# Patient Record
Sex: Female | Born: 1970 | Race: White | Hispanic: No | Marital: Married | Smoking: Never smoker
Health system: Southern US, Community
[De-identification: ages and names within clinical notes are randomized; demographics above are authoritative.]

## PROBLEM LIST (undated history)

## (undated) DIAGNOSIS — D849 Immunodeficiency, unspecified: Secondary | ICD-10-CM

## (undated) DIAGNOSIS — I10 Essential (primary) hypertension: Secondary | ICD-10-CM

---

## 2007-07-26 ENCOUNTER — Encounter: Admission: RE | Admit: 2007-07-26 | Discharge: 2007-07-26 | Payer: Self-pay | Admitting: Gastroenterology

## 2007-12-06 ENCOUNTER — Ambulatory Visit (HOSPITAL_COMMUNITY): Admission: RE | Admit: 2007-12-06 | Discharge: 2007-12-06 | Payer: Self-pay | Admitting: Obstetrics and Gynecology

## 2008-03-04 ENCOUNTER — Encounter: Admission: RE | Admit: 2008-03-04 | Discharge: 2008-03-04 | Payer: Self-pay | Admitting: Obstetrics and Gynecology

## 2009-06-11 ENCOUNTER — Encounter: Admission: RE | Admit: 2009-06-11 | Discharge: 2009-06-11 | Payer: Self-pay | Admitting: Orthopedic Surgery

## 2009-06-23 ENCOUNTER — Emergency Department (HOSPITAL_COMMUNITY): Admission: EM | Admit: 2009-06-23 | Discharge: 2009-06-23 | Payer: Self-pay | Admitting: Emergency Medicine

## 2009-11-17 ENCOUNTER — Ambulatory Visit (HOSPITAL_COMMUNITY): Admission: RE | Admit: 2009-11-17 | Discharge: 2009-11-17 | Payer: Self-pay | Admitting: Gastroenterology

## 2009-12-15 ENCOUNTER — Encounter: Admission: RE | Admit: 2009-12-15 | Discharge: 2009-12-15 | Payer: Self-pay | Admitting: Internal Medicine

## 2010-01-29 ENCOUNTER — Encounter: Admission: RE | Admit: 2010-01-29 | Discharge: 2010-01-29 | Payer: Self-pay | Admitting: Obstetrics and Gynecology

## 2010-07-19 LAB — CBC
HCT: 43.8 % (ref 36.0–46.0)
Hemoglobin: 15.4 g/dL — ABNORMAL HIGH (ref 12.0–15.0)
MCV: 90.2 fL (ref 78.0–100.0)
Platelets: 288 10*3/uL (ref 150–400)
WBC: 5.6 10*3/uL (ref 4.0–10.5)

## 2010-07-19 LAB — DIFFERENTIAL
Basophils Absolute: 0 10*3/uL (ref 0.0–0.1)
Basophils Relative: 1 % (ref 0–1)
Lymphocytes Relative: 33 % (ref 12–46)
Neutro Abs: 3.3 10*3/uL (ref 1.7–7.7)
Neutrophils Relative %: 59 % (ref 43–77)

## 2010-07-19 LAB — POCT I-STAT, CHEM 8
Chloride: 107 mEq/L (ref 96–112)
Creatinine, Ser: 0.6 mg/dL (ref 0.4–1.2)
Hemoglobin: 15.6 g/dL — ABNORMAL HIGH (ref 12.0–15.0)
Potassium: 4 mEq/L (ref 3.5–5.1)
Sodium: 140 mEq/L (ref 135–145)

## 2010-07-19 LAB — URINALYSIS, ROUTINE W REFLEX MICROSCOPIC
Glucose, UA: NEGATIVE mg/dL
Ketones, ur: NEGATIVE mg/dL
Nitrite: NEGATIVE
Protein, ur: NEGATIVE mg/dL
Urobilinogen, UA: 0.2 mg/dL (ref 0.0–1.0)

## 2010-07-19 LAB — PROTIME-INR
INR: 0.96 (ref 0.00–1.49)
Prothrombin Time: 12.7 seconds (ref 11.6–15.2)

## 2010-07-19 LAB — COMPREHENSIVE METABOLIC PANEL
Alkaline Phosphatase: 75 U/L (ref 39–117)
BUN: 8 mg/dL (ref 6–23)
Chloride: 103 mEq/L (ref 96–112)
Creatinine, Ser: 0.81 mg/dL (ref 0.4–1.2)
GFR calc non Af Amer: >60 mL/min (ref >60)
Glucose, Bld: 91 mg/dL (ref 70–99)
Potassium: 4 mEq/L (ref 3.5–5.1)
Total Bilirubin: 1.1 mg/dL (ref 0.3–1.2)

## 2010-07-19 LAB — POCT CARDIAC MARKERS
CKMB, poc: <1 ng/mL — ABNORMAL LOW (ref 1.0–8.0)
Myoglobin, poc: 51.9 ng/mL (ref 12–200)
Troponin i, poc: <0.05 ng/mL (ref 0.00–0.09)

## 2010-07-19 LAB — RAPID URINE DRUG SCREEN, HOSP PERFORMED: Benzodiazepines: NOT DETECTED

## 2010-07-19 LAB — ETHANOL: Alcohol, Ethyl (B): <5 mg/dL (ref 0–10)

## 2010-07-20 ENCOUNTER — Other Ambulatory Visit: Payer: Self-pay | Admitting: Otolaryngology

## 2010-07-20 DIAGNOSIS — R221 Localized swelling, mass and lump, neck: Secondary | ICD-10-CM

## 2010-07-27 ENCOUNTER — Ambulatory Visit
Admission: RE | Admit: 2010-07-27 | Discharge: 2010-07-27 | Disposition: A | Discharge status: Home / Self Care | Payer: BC Managed Care – PPO | Source: Ambulatory Visit | Attending: Otolaryngology | Admitting: Otolaryngology

## 2010-07-27 DIAGNOSIS — R221 Localized swelling, mass and lump, neck: Secondary | ICD-10-CM

## 2010-07-27 MED ORDER — IOHEXOL 300 MG/ML  SOLN
75.0000 mL | Freq: Once | INTRAMUSCULAR | Status: AC | PRN
  Administered 2010-07-27: 75 mL via INTRAVENOUS

## 2010-09-07 NOTE — Op Note (Signed)
NAME:  Eileen Jones, Eileen Jones NO.:  [id number]   MEDICAL RECORD NO.:  [id number]          PATIENT TYPE:  AMB   LOCATION:  SDC                           FACILITY:  [facility]   PHYSICIAN:  Malva Limes, M.D.    DATE OF BIRTH:  [birth year]   DATE OF PROCEDURE:  12/06/2007  DATE OF DISCHARGE:                               OPERATIVE REPORT   PREOPERATIVE DIAGNOSIS:  Patient desires permanent sterilization.   POSTOPERATIVE DIAGNOSIS:  Patient desires permanent sterilization.   PROCEDURES:  1. Removal of intrauterine device.  2. Bilateral tubal ligation, placement of Filshie clips.   SURGEON:  Malva Limes, MD   ANESTHESIA:  General endotracheal.   ANTIBIOTIC:  Ancef 1 g.   DRAINS:  None.   ESTIMATED BLOOD LOSS:  Minimal.   COMPLICATIONS:  None.   SPECIMENS:  None.   FINDINGS:  The patient had adhesions involving her ascending colon and  the right lower quadrant.  Fallopian tubes and ovaries were normal  bilaterally.  The uterus appeared normal.  There was no evidence of any  pelvic adhesions or endometriosis.   PROCEDURE:  The patient was taken to the operating room where she was  placed in dorsal supine position and general anesthetic was administered  without difficulty.  She was then placed in a dorsal lithotomy position.  She was prepped and draped in the usual fashion for this procedure.  A  sterile speculum was placed in the vagina.  The IUD was removed and  appeared to be a ParaGard IUD.  At that point, the Hulka tenaculum was  applied to the anterior cervical lip.  Next, the umbilicus was injected  with 0.25% Marcaine.  A vertical skin incision was made.  This was  carried down to the fascia.  The fascia was grasped with Kochers,  entered sharply.  Parietal peritoneum was grasped, entered sharply.  A 0  Vicryl suture was placed in a pursestring fashion.  The Hasson cannula  was placed into the abdominal cavity.  A 3 L of carbon dioxide was  insufflated.  The  patient was then placed in Trendelenburg.  On  examination, the patient had a normal-appearing liver and gallbladder.  The pelvis was as described above.  At this point, a Filshie clip was  placed in the isthmic portion of the left fallopian tube.  The clip was  placed perpendicular to the tube.  The entire tube appeared to be within  the clasp.  After the clasp was placed, it was noted that the round  ligament was also within the clasp.  The clasp appeared to be tightly  closed.  There was no evidence of any bleeding.  It was felt that an  attempt to try and remove the clip from the round ligament would create  excessive bleeding and possible complications.  It was felt that the  clip was tightly applied; and therefore, adequate sterilization would be  obtained.  Clip was placed on the right fallopian tube.  Next, the  entire tube appeared to be tightly closed with in the clasp.  At this  point, the  adhesions involving the ascending colon and the right lower  quadrant were taken down with sharp dissection.  The patient had been  complaining of right lower quadrant pain.  This concluded the procedure.  The instruments removed.  Pneumoperitoneum was released.  Fascia was  closed with 0 Vicryl suture in a pursestring fashion.  The skin was  closed with 3-0 Rapide in an interrupted fashion.  The patient was  extubated and taken to the recovery room in stable condition.  Instrument and lap counts correct x2.           ______________________________  Malva Limes, M.D.     MA/MEDQ  D:  12/06/2007  T:  12/07/2007  Job:  [id number]

## 2011-01-03 ENCOUNTER — Other Ambulatory Visit (HOSPITAL_COMMUNITY): Payer: Self-pay | Admitting: Gastroenterology

## 2011-01-03 DIAGNOSIS — K117 Disturbances of salivary secretion: Secondary | ICD-10-CM

## 2011-01-18 ENCOUNTER — Ambulatory Visit (HOSPITAL_COMMUNITY)
Admission: RE | Admit: 2011-01-18 | Discharge: 2011-01-18 | Disposition: A | Discharge status: Home / Self Care | Payer: BC Managed Care – PPO | Source: Ambulatory Visit | Attending: Gastroenterology | Admitting: Gastroenterology

## 2011-01-18 ENCOUNTER — Encounter (HOSPITAL_COMMUNITY)
Admission: RE | Admit: 2011-01-18 | Discharge: 2011-01-18 | Disposition: A | Discharge status: Home / Self Care | Payer: No Typology Code available for payment source | Source: Ambulatory Visit | Attending: Gastroenterology | Admitting: Gastroenterology

## 2011-01-18 DIAGNOSIS — K117 Disturbances of salivary secretion: Secondary | ICD-10-CM | POA: Insufficient documentation

## 2011-01-18 DIAGNOSIS — R948 Abnormal results of function studies of other organs and systems: Secondary | ICD-10-CM | POA: Insufficient documentation

## 2011-01-18 DIAGNOSIS — R634 Abnormal weight loss: Secondary | ICD-10-CM | POA: Insufficient documentation

## 2011-01-18 MED ORDER — TECHNETIUM TC 99M MEDRONATE IV KIT
25.0000 | PACK | Freq: Once | INTRAVENOUS | Status: AC | PRN
  Administered 2011-01-18: 25 via INTRAVENOUS

## 2011-01-21 LAB — CBC
MCHC: 33.9
RBC: 4.65
RDW: 13

## 2011-01-21 LAB — PREGNANCY, URINE: Preg Test, Ur: NEGATIVE

## 2012-01-04 ENCOUNTER — Other Ambulatory Visit: Payer: Self-pay | Admitting: Obstetrics and Gynecology

## 2012-01-04 DIAGNOSIS — R928 Other abnormal and inconclusive findings on diagnostic imaging of breast: Secondary | ICD-10-CM

## 2012-01-05 ENCOUNTER — Ambulatory Visit
Admission: RE | Admit: 2012-01-05 | Discharge: 2012-01-05 | Disposition: A | Discharge status: Home / Self Care | Payer: No Typology Code available for payment source | Source: Ambulatory Visit | Attending: Obstetrics and Gynecology | Admitting: Obstetrics and Gynecology

## 2012-01-05 DIAGNOSIS — R928 Other abnormal and inconclusive findings on diagnostic imaging of breast: Secondary | ICD-10-CM

## 2012-04-21 ENCOUNTER — Ambulatory Visit (INDEPENDENT_AMBULATORY_CARE_PROVIDER_SITE_OTHER): Payer: BC Managed Care – PPO | Admitting: Emergency Medicine

## 2012-04-21 VITALS — BP 129/85 | HR 77 | Temp 98.2°F | Resp 16 | Ht 68.0 in | Wt 152.0 lb

## 2012-04-21 DIAGNOSIS — N3000 Acute cystitis without hematuria: Secondary | ICD-10-CM

## 2012-04-21 DIAGNOSIS — R300 Dysuria: Secondary | ICD-10-CM

## 2012-04-21 LAB — POCT UA - MICROSCOPIC ONLY
Casts, Ur, LPF, POC: NEGATIVE
Crystals, Ur, HPF, POC: NEGATIVE
Yeast, UA: NEGATIVE

## 2012-04-21 LAB — POCT URINALYSIS DIPSTICK
Bilirubin, UA: NEGATIVE
Blood, UA: NEGATIVE
Glucose, UA: NEGATIVE
Ketones, UA: NEGATIVE
Leukocytes, UA: NEGATIVE
Nitrite, UA: NEGATIVE
Protein, UA: NEGATIVE
Spec Grav, UA: 1.01
Urobilinogen, UA: 0.2
pH, UA: 6

## 2012-04-21 MED ORDER — CIPROFLOXACIN HCL 500 MG PO TABS: 500.0000 mg | ORAL_TABLET | Freq: Two times a day (BID) | ORAL | Status: DC

## 2012-04-21 MED ORDER — PHENAZOPYRIDINE HCL 200 MG PO TABS: 200.0000 mg | ORAL_TABLET | Freq: Three times a day (TID) | ORAL | Status: DC | PRN

## 2012-04-21 NOTE — Addendum Note (Signed)
Addended by: Carmelina Dane on: 04/21/2012 07:09 PM   Modules accepted: Orders

## 2012-04-21 NOTE — Progress Notes (Signed)
[facility] [address] [place] [address] [id number] [phone]  Date:  04/21/2012   Name:  Eileen Jones   DOB:  20-Feb-1971   MRN:  [id number]  PCP:  No primary provider on file.    Chief Complaint: Dysuria   History of Present Illness:  Eileen Jones is a 41 y.o. very pleasant female patient who presents with the following:  Dysuria, urgency and frequency with malaise.  Started a week ago and not improving with hydration.  Chills but no fever.  No vaginal discharge or bleeding.  No history of antibiotic use.  Yesterday became worse.  Not sexually active so no dyspareunia.    There is no problem list on file for this patient.   No past medical history on file.  No past surgical history on file.  History  Substance Use Topics  . Smoking status: Never Smoker   . Smokeless tobacco: Not on file  . Alcohol Use: Not on file    No family history on file.  No Known Allergies  Medication list has been reviewed and updated.  Current Outpatient Prescriptions on File Prior to Visit  Medication Sig Dispense Refill  . montelukast (SINGULAIR) 10 MG tablet Take 10 mg by mouth at bedtime.      . nebivolol (BYSTOLIC) 5 MG tablet Take 5 mg by mouth daily.        Review of Systems:  As per HPI, otherwise negative.    Physical Examination: Filed Vitals:   04/21/12 1750  BP: 129/85  Pulse: 77  Temp: 98.2 F (36.8 C)  Resp: 16   Filed Vitals:   04/21/12 1750  Height: 5\' 8"  (1.727 m)  Weight: 152 lb (68.947 kg)   Body mass index is 23.11 kg/(m^2). Ideal Body Weight: Weight in (lb) to have BMI = 25: 164.1   GEN: WDWN, NAD, Non-toxic, A & O x 3 HEENT: Atraumatic, Normocephalic. Neck supple. No masses, No LAD. Ears and Nose: No external deformity. CV: RRR, No M/G/R. No JVD. No thrill. No extra heart sounds. PULM: CTA B, no wheezes, crackles, rhonchi. No retractions. No resp. distress. No accessory muscle use. ABD: S, NT, ND, +BS. No rebound. No  HSM. EXTR: No c/c/e NEURO Normal gait.  PSYCH: Normally interactive. Conversant. Not depressed or anxious appearing.  Calm demeanor.    Assessment and Plan: Acute cystitis cipro Pyridium Urine culture  Carmelina Dane, MD  Results for orders placed in visit on 04/21/12  POCT URINALYSIS DIPSTICK      Component Value Range   Color, UA yellow     Clarity, UA hazy     Glucose, UA neg     Bilirubin, UA neg     Ketones, UA neg     Spec Grav, UA 1.010     Blood, UA neg     pH, UA 6.0     Protein, UA neg     Urobilinogen, UA 0.2     Nitrite, UA neg     Leukocytes, UA Negative    POCT UA - MICROSCOPIC ONLY      Component Value Range   WBC, Ur, HPF, POC 0-5     RBC, urine, microscopic 0-1     Bacteria, U Microscopic 1+     Mucus, UA small     Epithelial cells, urine per micros tntc--lots of large clumps and cloth strings     Crystals, Ur, HPF, POC neg     Casts, Ur, LPF, POC  neg     Yeast, UA neg

## 2012-04-23 LAB — URINE CULTURE

## 2014-02-20 ENCOUNTER — Encounter (HOSPITAL_COMMUNITY): Payer: Self-pay | Admitting: Emergency Medicine

## 2014-02-20 ENCOUNTER — Emergency Department (HOSPITAL_COMMUNITY): Payer: Federal, State, Local not specified - PPO

## 2014-02-20 ENCOUNTER — Emergency Department (HOSPITAL_COMMUNITY)
Admission: EM | Admit: 2014-02-20 | Discharge: 2014-02-21 | Disposition: A | Discharge status: Home / Self Care | Payer: Federal, State, Local not specified - PPO | Attending: Emergency Medicine | Admitting: Emergency Medicine

## 2014-02-20 DIAGNOSIS — Z79899 Other long term (current) drug therapy: Secondary | ICD-10-CM | POA: Insufficient documentation

## 2014-02-20 DIAGNOSIS — I10 Essential (primary) hypertension: Secondary | ICD-10-CM | POA: Diagnosis not present

## 2014-02-20 DIAGNOSIS — M542 Cervicalgia: Secondary | ICD-10-CM | POA: Insufficient documentation

## 2014-02-20 DIAGNOSIS — R51 Headache: Secondary | ICD-10-CM | POA: Diagnosis not present

## 2014-02-20 DIAGNOSIS — H1132 Conjunctival hemorrhage, left eye: Secondary | ICD-10-CM | POA: Insufficient documentation

## 2014-02-20 DIAGNOSIS — R519 Headache, unspecified: Secondary | ICD-10-CM

## 2014-02-20 DIAGNOSIS — Z862 Personal history of diseases of the blood and blood-forming organs and certain disorders involving the immune mechanism: Secondary | ICD-10-CM | POA: Diagnosis not present

## 2014-02-20 HISTORY — DX: Essential (primary) hypertension: I10

## 2014-02-20 HISTORY — DX: Immunodeficiency, unspecified: D84.9

## 2014-02-20 LAB — COMPREHENSIVE METABOLIC PANEL
ALT: 19 U/L (ref 0–35)
AST: 18 U/L (ref 0–37)
Albumin: 4.1 g/dL (ref 3.5–5.2)
Alkaline Phosphatase: 74 U/L (ref 39–117)
Anion gap: 12 (ref 5–15)
BILIRUBIN TOTAL: 0.3 mg/dL (ref 0.3–1.2)
BUN: 8 mg/dL (ref 6–23)
CALCIUM: 9 mg/dL (ref 8.4–10.5)
CHLORIDE: 103 meq/L (ref 96–112)
CO2: 25 meq/L (ref 19–32)
CREATININE: 0.76 mg/dL (ref 0.50–1.10)
GLUCOSE: 102 mg/dL — AB (ref 70–99)
Potassium: 4.2 mEq/L (ref 3.7–5.3)
Sodium: 140 mEq/L (ref 137–147)
Total Protein: 7.4 g/dL (ref 6.0–8.3)

## 2014-02-20 LAB — CBC WITH DIFFERENTIAL/PLATELET
BASOS ABS: 0 10*3/uL (ref 0.0–0.1)
Basophils Relative: 0 % (ref 0–1)
EOS PCT: 1 % (ref 0–5)
Eosinophils Absolute: 0.1 10*3/uL (ref 0.0–0.7)
HEMATOCRIT: 38.1 % (ref 36.0–46.0)
HEMOGLOBIN: 12.9 g/dL (ref 12.0–15.0)
LYMPHS ABS: 2.1 10*3/uL (ref 0.7–4.0)
LYMPHS PCT: 27 % (ref 12–46)
MCH: 29.4 pg (ref 26.0–34.0)
MCHC: 33.9 g/dL (ref 30.0–36.0)
MCV: 86.8 fL (ref 78.0–100.0)
MONO ABS: 0.5 10*3/uL (ref 0.1–1.0)
Monocytes Relative: 6 % (ref 3–12)
NEUTROS ABS: 4.9 10*3/uL (ref 1.7–7.7)
Neutrophils Relative %: 66 % (ref 43–77)
Platelets: 312 10*3/uL (ref 150–400)
RBC: 4.39 MIL/uL (ref 3.87–5.11)
RDW: 13.4 % (ref 11.5–15.5)
WBC: 7.6 10*3/uL (ref 4.0–10.5)

## 2014-02-20 MED ORDER — PROCHLORPERAZINE EDISYLATE 5 MG/ML IJ SOLN
10.0000 mg | Freq: Four times a day (QID) | INTRAMUSCULAR | Status: DC | PRN
  Administered 2014-02-20: 10 mg via INTRAVENOUS
  Filled 2014-02-20: qty 2

## 2014-02-20 MED ORDER — DIPHENHYDRAMINE HCL 50 MG/ML IJ SOLN
25.0000 mg | Freq: Once | INTRAMUSCULAR | Status: AC
  Administered 2014-02-20: 25 mg via INTRAVENOUS
  Filled 2014-02-20: qty 1

## 2014-02-20 MED ORDER — SODIUM CHLORIDE 0.9 % IV BOLUS (SEPSIS)
1000.0000 mL | Freq: Once | INTRAVENOUS | Status: AC
  Administered 2014-02-20: 1000 mL via INTRAVENOUS

## 2014-02-20 NOTE — ED Notes (Signed)
Pt. reports occipital/left temporal headache onset last week , left eye pain / bloodshot sclera onset Friday  Denies injury , and pain at back of neck onset last week . Alert and oriented / speech clear , no facial asymmetry. Equal strong grips with no arm drift .

## 2014-02-20 NOTE — ED Provider Notes (Signed)
CSN: [id number]     Arrival date & time 02/20/14  2001 History   First MD Initiated Contact with Patient 02/20/14 2259     Chief Complaint  Patient presents with  . Headache  . Neck Pain  . Eye Problem     (Consider location/radiation/quality/duration/timing/severity/associated sxs/prior Treatment) HPI  [name] with a history of hypertension and mass cell disorder who presents with headache. Patient reports onset of temporal and occipital headache last week. She states that it was very sharp knife my head." Also had associated neck pain. Headache improved over the weekend but symptoms returned last night and patient states "I could feel my heart" my ears." Patient denies any history of migraines. She's not taking anything at home for her pain. Patient states that she also developed left eye redness last week.  She states that her eye feels "uncomfortable" but denies any pain.  She reports that things seemed "blurry out of the left eye." Denies any double vision or peripheral vision loss. Patient denies any fevers. She does endorse left neck pain without stiffness. Patient reports a history of vasculitis and sudden death in her family and is worried about this.  Past Medical History  Diagnosis Date  . Hypertension   . Immune deficiency disorder     auto immune disorder   History reviewed. No pertinent past surgical history. No family history on file. History  Substance Use Topics  . Smoking status: Never Smoker   . Smokeless tobacco: Not on file  . Alcohol Use: Yes   OB History   Grav Para Term Preterm Abortions TAB SAB Ect Mult Living                 Review of Systems  Constitutional: Negative for fever.  HENT: Negative for sinus pressure.        Eye redness  Eyes: Positive for redness and visual disturbance. Negative for photophobia and pain.  Respiratory: Negative for chest tightness and shortness of breath.   Cardiovascular: Negative for chest pain.   Gastrointestinal: Negative for nausea, vomiting and abdominal pain.  Genitourinary: Negative for dysuria.  Musculoskeletal: Positive for neck pain. Negative for back pain and neck stiffness.  Skin: Negative for wound.  Neurological: Positive for headaches. Negative for dizziness, speech difficulty, weakness and light-headedness.  Psychiatric/Behavioral: Negative for confusion.  All other systems reviewed and are negative.     Allergies  Review of patient's allergies indicates no known allergies.  Home Medications   Prior to Admission medications   Medication Sig Start Date End Date Taking? Authorizing Provider  nebivolol (BYSTOLIC) 5 MG tablet Take 5 mg by mouth daily.   Yes Historical Provider, MD   BP 142/92  Pulse 81  Temp(Src) 98.3 F (36.8 C) (Oral)  Resp 17  Ht 5\' 8"  (1.727 m)  Wt 148 lb (67.132 kg)  BMI 22.51 kg/m2  SpO2 98%  LMP 02/17/2014 Physical Exam  Nursing note and vitals reviewed. Constitutional: She is oriented to person, place, and time. She appears well-developed and well-nourished. No distress.  HENT:  Head: Normocephalic and atraumatic.  Mouth/Throat: Oropharynx is clear and moist.  No tenderness palpation over the left temporal artery  Eyes: EOM are normal. Pupils are equal, round, and reactive to light.  Subconjunctival hemorrhage noted over the left lateral sclera  Neck: Normal range of motion. Neck supple.  No meningismus noted  Cardiovascular: Normal rate, regular rhythm and normal heart sounds.   No murmur heard. Pulmonary/Chest: Effort normal  and breath sounds normal. No respiratory distress. She has no wheezes.  Abdominal: Soft. Bowel sounds are normal. There is no tenderness. There is no rebound.  Musculoskeletal: She exhibits no edema.  Neurological: She is alert and oriented to person, place, and time.  Cranial nerves II through XII intact, no dysmetria to finger-nose-finger, equal grip strengths bilaterally, visual fields intact  Skin:  Skin is warm and dry.  Psychiatric: She has a normal mood and affect.    ED Course  Procedures (including critical care time) Labs Review Labs Reviewed  COMPREHENSIVE METABOLIC PANEL - Abnormal; Notable for the following:    Glucose, Bld 102 (*)    All other components within normal limits  CBC WITH DIFFERENTIAL  SEDIMENTATION RATE    Imaging Review Ct Head Wo Contrast  02/21/2014   CLINICAL DATA:  Hypertension, LEFT-sided headache and neck pain for 6 days. Immunocompromised patient.  EXAM: CT HEAD WITHOUT CONTRAST  TECHNIQUE: Contiguous axial images were obtained from the base of the skull through the vertex without intravenous contrast.  COMPARISON:  MRI of the brain with and without contrast June 23, 2009  FINDINGS: The ventricles and sulci are normal. No intraparenchymal hemorrhage, mass effect nor midline shift. No acute large vascular territory infarcts.  No abnormal extra-axial fluid collections. Basal cisterns are patent. Subcentimeter pineal cyst again noted.  No skull fracture. The included ocular globes and orbital contents are non-suspicious. The mastoid aircells and included paranasal sinuses are well-aerated.  IMPRESSION: No acute intracranial process. Stable appearance of the brain from prior imaging.   Electronically Signed   By: Awilda Metroourtnay  Bloomer   On: 02/21/2014 00:34     EKG Interpretation None      MDM   Final diagnoses:  Headache  Subconjunctival hemorrhage of left eye    Patient presents with headache and changes in her sclera of her left eye. She is nontoxic and nonfocal on exam. Afebrile. No history of migraines. On chart review it appears patient had an MRI several years ago to rule out MS. No visual deficits for acuity changes noted on exam. Given patient's concern for vasculitis, will obtain CT to rule out bleed. Sedimentation rate obtained given eye symptoms to evaluate for temporal arteritis. Workup is all reassuring. Patient was given a migraine cocktail  with improvement of her symptoms and improvement of her blood pressure. Discussed results with patient. She was reassured. [facility]  After history, exam, and medical workup I feel the patient has been appropriately medically screened and is safe for discharge home. Pertinent diagnoses were discussed with the patient. Patient was given return precautions.     Shon Batonourtney F Horton, MD 02/21/14 [phone]

## 2014-02-20 NOTE — ED Notes (Signed)
Pt reports bilateral leg swelling over past week that comes and goes.

## 2014-02-21 LAB — SEDIMENTATION RATE: Sed Rate: 8 mm/hr (ref 0–22)

## 2014-02-21 NOTE — ED Notes (Signed)
Visual Acuity: Both eyes: 20/15, Left eye covered: 20/20, Right eye covered: 20/20

## 2014-02-21 NOTE — Discharge Instructions (Signed)
General Headache Without Cause A headache is pain or discomfort felt around the head or neck area. The specific cause of a headache may not be found. There are many causes and types of headaches. A few common ones are:  Tension headaches.  Migraine headaches.  Cluster headaches.  Chronic daily headaches. HOME CARE INSTRUCTIONS   Keep all follow-up appointments with your caregiver or any specialist referral.  Only take over-the-counter or prescription medicines for pain or discomfort as directed by your caregiver.  Lie down in a dark, quiet room when you have a headache.  Keep a headache journal to find out what may trigger your migraine headaches. For example, write down:  What you eat and drink.  How much sleep you get.  Any change to your diet or medicines.  Try massage or other relaxation techniques.  Put ice packs or heat on the head and neck. Use these 3 to 4 times per day for 15 to 20 minutes each time, or as needed.  Limit stress.  Sit up straight, and do not tense your muscles.  Quit smoking if you smoke.  Limit alcohol use.  Decrease the amount of caffeine you drink, or stop drinking caffeine.  Eat and sleep on a regular schedule.  Get 7 to 9 hours of sleep, or as recommended by your caregiver.  Keep lights dim if bright lights bother you and make your headaches worse. SEEK MEDICAL CARE IF:   You have problems with the medicines you were prescribed.  Your medicines are not working.  You have a change from the usual headache.  You have nausea or vomiting. SEEK IMMEDIATE MEDICAL CARE IF:   Your headache becomes severe.  You have a fever.  You have a stiff neck.  You have loss of vision.  You have muscular weakness or loss of muscle control.  You start losing your balance or have trouble walking.  You feel faint or pass out.  You have severe symptoms that are different from your first symptoms. MAKE SURE YOU:   Understand these  instructions.  Will watch your condition.  Will get help right away if you are not doing well or get worse. Document Released: 04/11/2005 Document Revised: 07/04/2011 Document Reviewed: 04/27/2011 [facility] Patient Information 2015 [place], [place]. This information is not intended to replace advice given to you by your health care provider. Make sure you discuss any questions you have with your health care provider. Subconjunctival Hemorrhage A subconjunctival hemorrhage is a bright red patch covering a portion of the white of the eye. The white part of the eye is called the sclera, and it is covered by a thin membrane called the conjunctiva. This membrane is clear, except for tiny blood vessels that you can see with the naked eye. When your eye is irritated or inflamed and becomes red, it is because the vessels in the conjunctiva are swollen. Sometimes, a blood vessel in the conjunctiva can break and bleed. When this occurs, the blood builds up between the conjunctiva and the sclera, and spreads out to create a red area. The red spot may be very small at first. It may then spread to cover a larger part of the surface of the eye, or even all of the visible white part of the eye. In almost all cases, the blood will go away and the eye will become white again. Before completely dissolving, however, the red area may spread. It may also become brownish-yellow in color before going away. If a  lot of blood collects under the conjunctiva, it may look like a bulge on the surface of the eye. This looks scary, but it will also eventually flatten out and go away. Subconjunctival hemorrhages do not cause pain, but if swollen, may cause a feeling of irritation. There is no effect on vision.  CAUSES   The most common cause is mild trauma (rubbing the eye, irritation).  Subconjunctival hemorrhages can happen because of coughing or straining (lifting heavy objects), vomiting, or sneezing.  In some cases, your doctor  may want to check your blood pressure. High blood pressure can also cause a subconjunctival hemorrhage.  Severe trauma or blunt injuries.  Diseases that affect blood clotting (hemophilia, leukemia).  Abnormalities of blood vessels behind the eye (carotid cavernous sinus fistula).  Tumors behind the eye.  Certain drugs (aspirin, Coumadin, heparin).  Recent eye surgery. HOME CARE INSTRUCTIONS   Do not worry about the appearance of your eye. You may continue your usual activities.  Often, follow-up is not necessary. SEEK MEDICAL CARE IF:   Your eye becomes painful.  The bleeding does not disappear within 3 weeks.  Bleeding occurs elsewhere, for example, under the skin, in the mouth, or in the other eye.  You have recurring subconjunctival hemorrhages. SEEK IMMEDIATE MEDICAL CARE IF:   Your vision changes or you have difficulty seeing.  You develop a severe headache, persistent vomiting, confusion, or abnormal drowsiness (lethargy).  Your eye seems to bulge or protrude from the eye socket.  You notice the sudden appearance of bruises or have spontaneous bleeding elsewhere on your body. Document Released: 04/11/2005 Document Revised: 08/26/2013 Document Reviewed: 03/09/2009 [facility] Patient Information 2015 [place], [place]. This information is not intended to replace advice given to you by your health care provider. Make sure you discuss any questions you have with your health care provider.

## 2016-05-04 IMAGING — CT CT HEAD W/O CM
1 series · 16 of 30 positions shown, 20 images · non-contrast
Comparison: MRI of the brain with and without contrast June 23, 2009

CLINICAL DATA: Hypertension, LEFT-sided headache and neck pain for
6 days. Immunocompromised patient.

EXAM:
CT HEAD WITHOUT CONTRAST
TECHNIQUE: Contiguous axial images were obtained from the base of the skull
through the vertex without intravenous contrast.

[Series 2: head 5.0 h30s · axial · 0.42mm/px · z∈[-133,+7]mm · 16 of 32 slices shown, 20 images]
[im 2/32  brain]
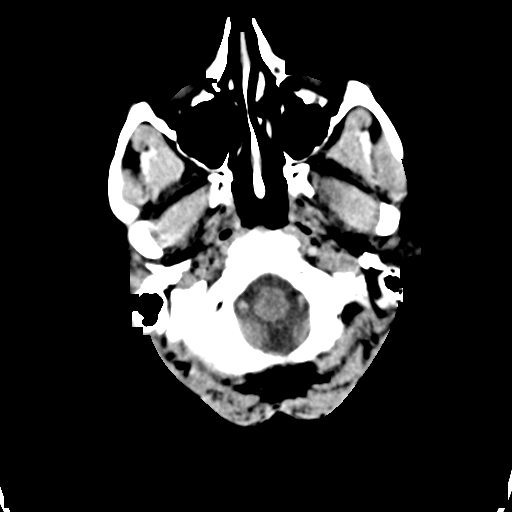
[im 2/32  bone]
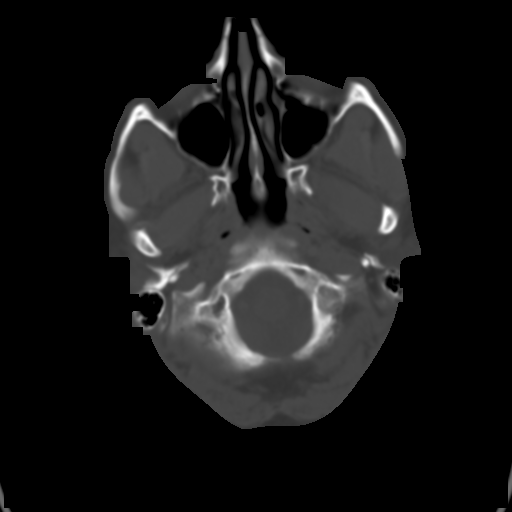
[im 4/32  brain]
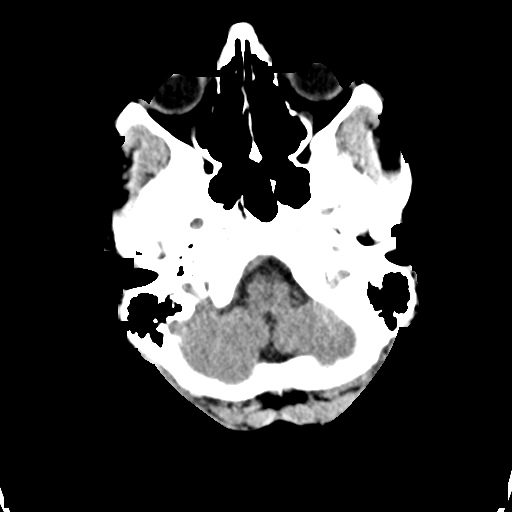
[im 6/32  brain]
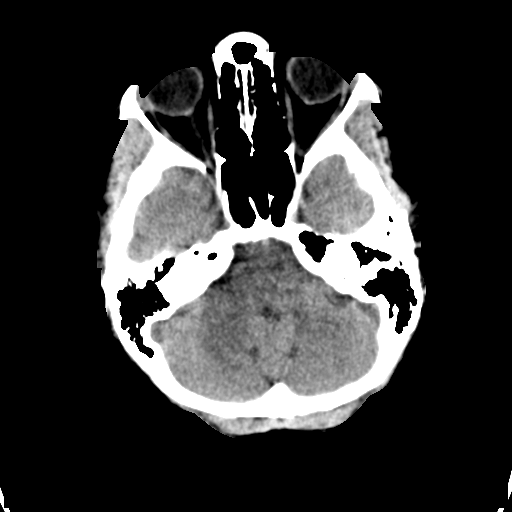
[im 8/32  brain]
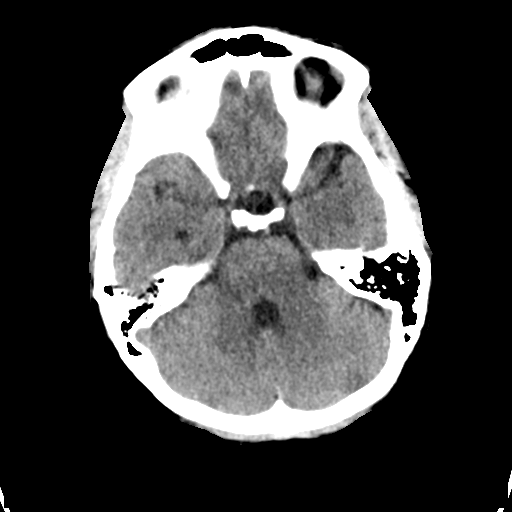
[im 9/32  brain]
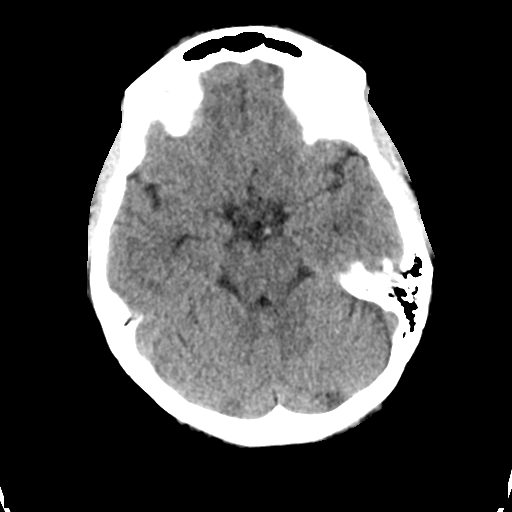
[im 9/32  bone]
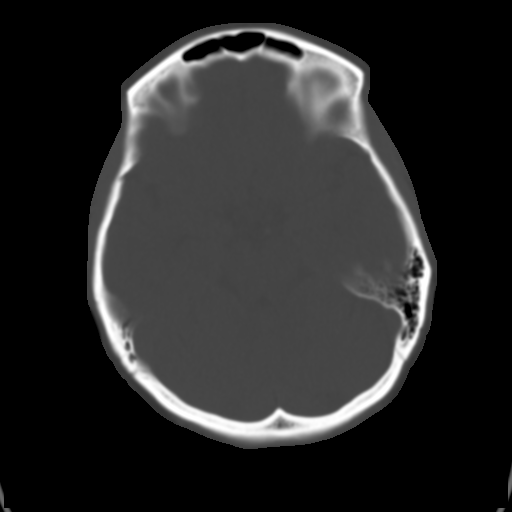
[im 11/32  brain]
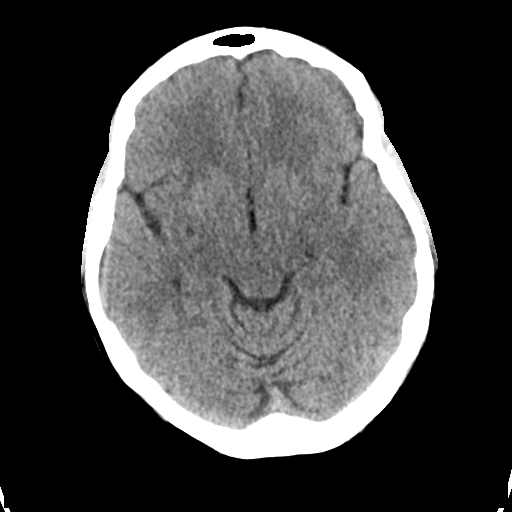
[im 13/32  brain]
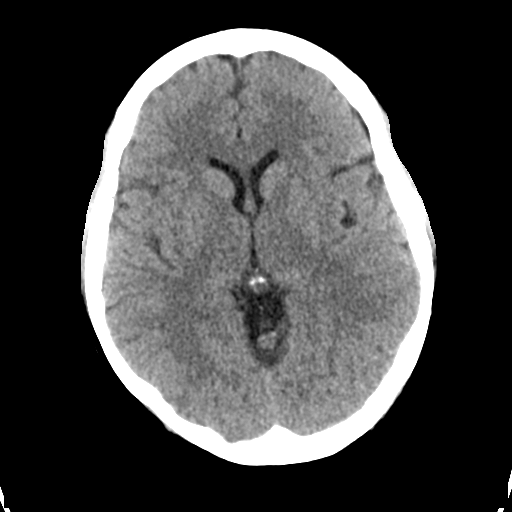
[im 15/32  brain]
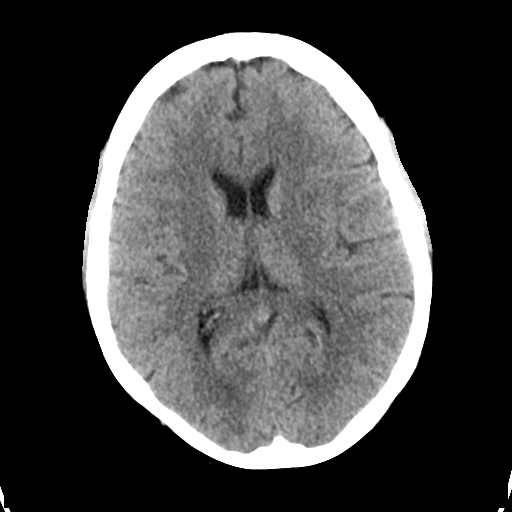
[im 17/32  brain]
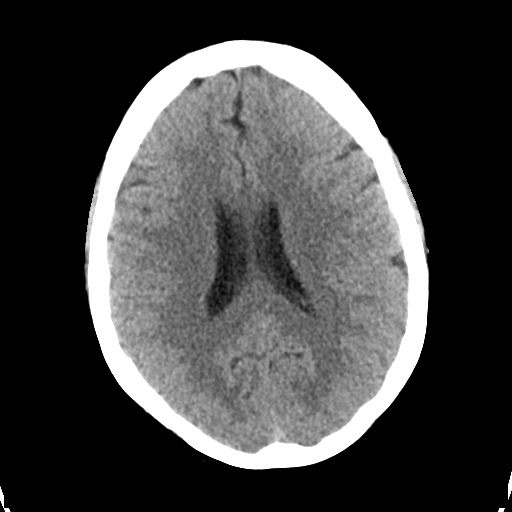
[im 17/32  bone]
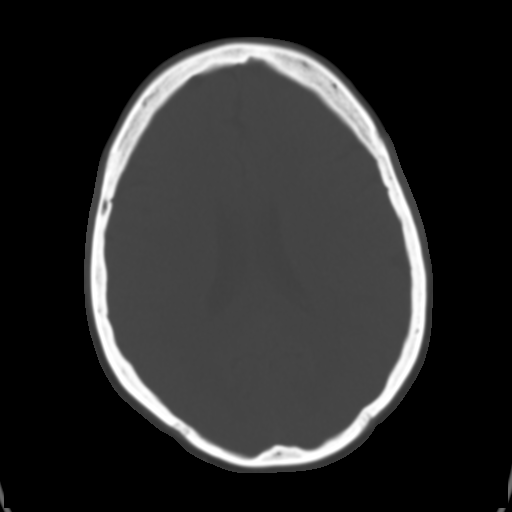
[im 19/32  brain]
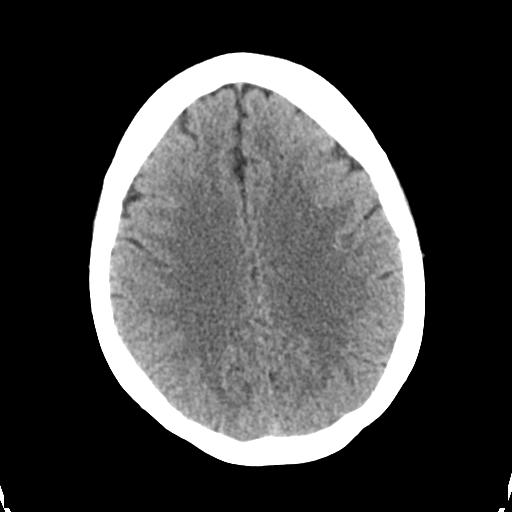
[im 21/32  brain]
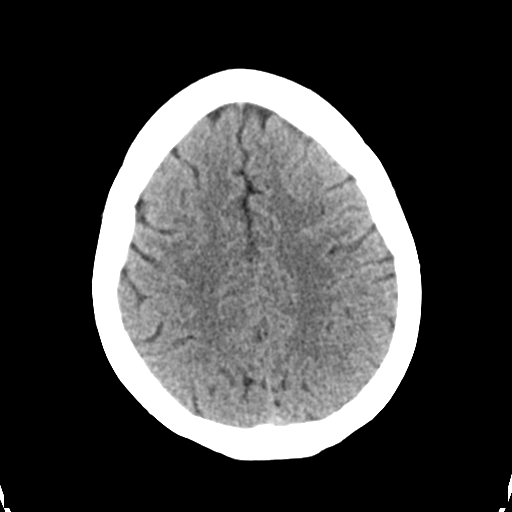
[im 23/32  brain]
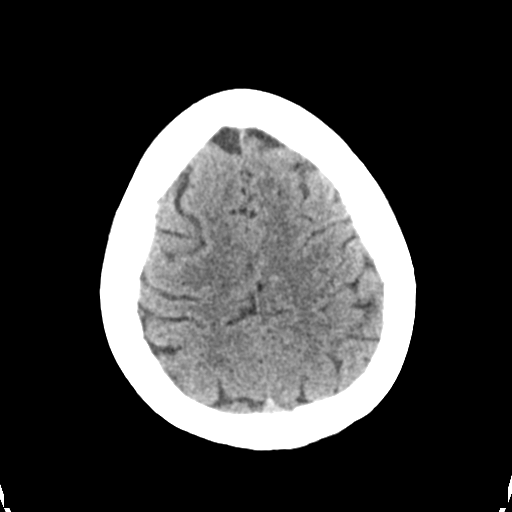
[im 24/32  brain]
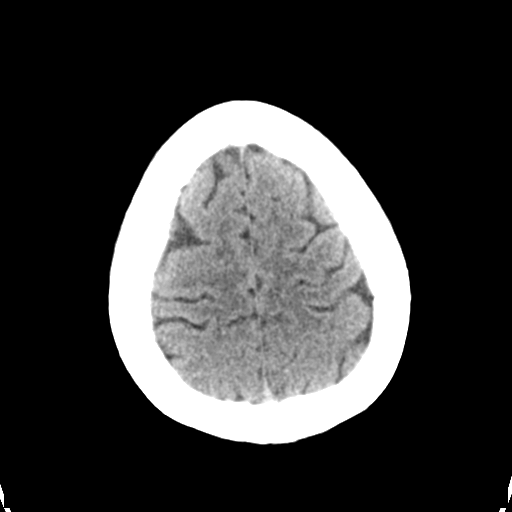
[im 24/32  bone]
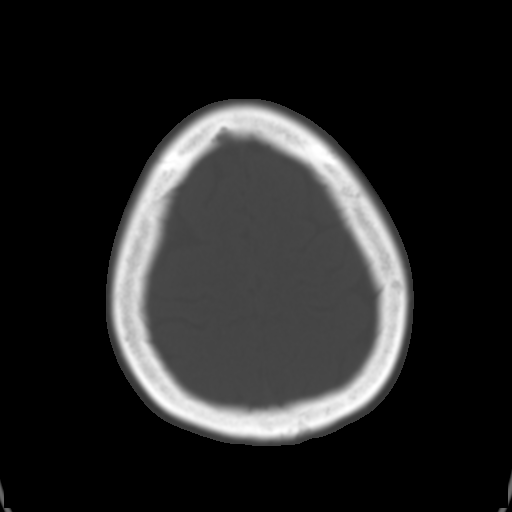
[im 26/32  brain]
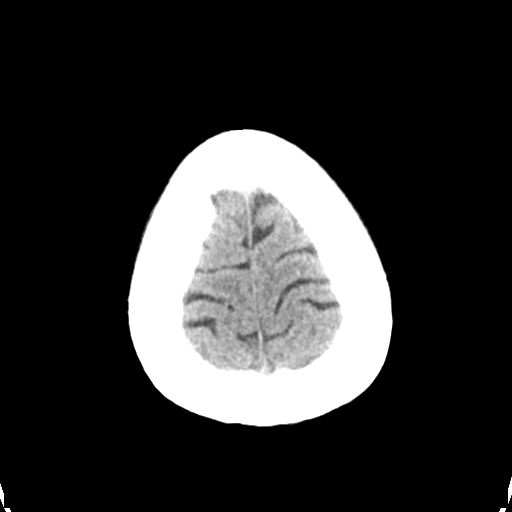
[im 28/32  brain]
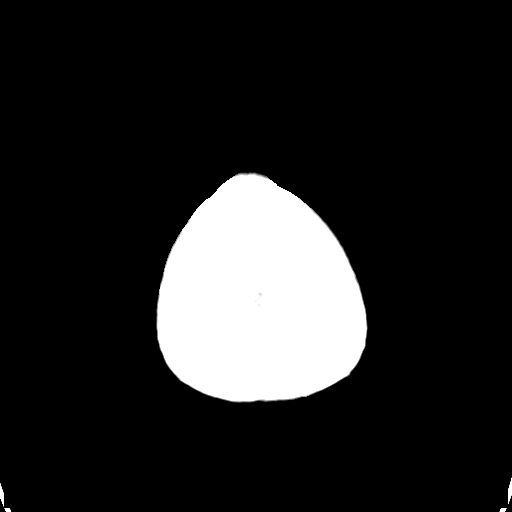
[im 30/32  brain]
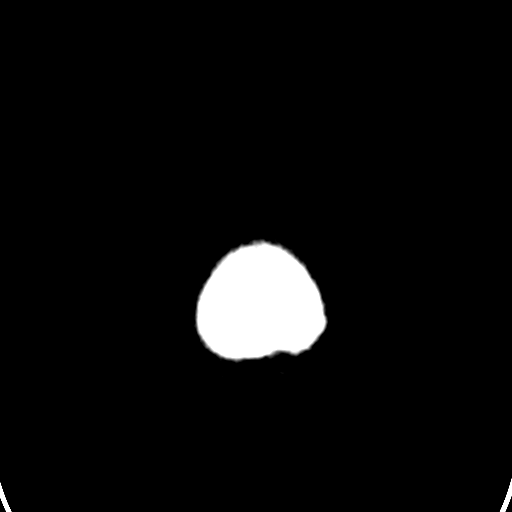

[16 of 30 positions shown; findings below may reference images not displayed]

FINDINGS: The ventricles and sulci are normal. No intraparenchymal hemorrhage,
mass effect nor midline shift. No acute large vascular territory
infarcts.

No abnormal extra-axial fluid collections. Basal cisterns are
patent. Subcentimeter pineal cyst again noted.

No skull fracture. The included ocular globes and orbital contents
are non-suspicious. The mastoid aircells and included paranasal
sinuses are well-aerated.
IMPRESSION: No acute intracranial process. Stable appearance of the brain from
prior imaging.

  By: Lincoln Cruz Sindhal
